# Patient Record
Sex: Male | Born: 1940 | Race: White | Hispanic: No | Smoking: Never smoker
Health system: Southern US, Community
[De-identification: ages and names within clinical notes are randomized; demographics above are authoritative.]

## PROBLEM LIST (undated history)

## (undated) DIAGNOSIS — F0390 Unspecified dementia without behavioral disturbance: Secondary | ICD-10-CM

## (undated) DIAGNOSIS — F329 Major depressive disorder, single episode, unspecified: Secondary | ICD-10-CM

## (undated) DIAGNOSIS — F32A Depression, unspecified: Secondary | ICD-10-CM

---

## 2005-09-28 ENCOUNTER — Ambulatory Visit: Payer: Self-pay | Admitting: General Practice

## 2005-11-29 ENCOUNTER — Ambulatory Visit: Payer: Self-pay | Admitting: General Practice

## 2007-04-27 ENCOUNTER — Emergency Department: Payer: Self-pay | Admitting: Emergency Medicine

## 2007-04-27 ENCOUNTER — Other Ambulatory Visit: Payer: Self-pay

## 2010-02-20 ENCOUNTER — Ambulatory Visit: Payer: Self-pay | Admitting: Unknown Physician Specialty

## 2010-02-21 LAB — PATHOLOGY REPORT

## 2013-07-25 ENCOUNTER — Emergency Department: Payer: Self-pay | Admitting: Emergency Medicine

## 2016-06-17 ENCOUNTER — Emergency Department
Admission: EM | Admit: 2016-06-17 | Discharge: 2016-06-17 | Disposition: A | Discharge status: Home / Self Care | Payer: Medicare Other | Attending: Emergency Medicine | Admitting: Emergency Medicine

## 2016-06-17 ENCOUNTER — Emergency Department: Payer: Medicare Other

## 2016-06-17 ENCOUNTER — Encounter: Payer: Self-pay | Admitting: Emergency Medicine

## 2016-06-17 DIAGNOSIS — R531 Weakness: Secondary | ICD-10-CM | POA: Diagnosis not present

## 2016-06-17 DIAGNOSIS — M7989 Other specified soft tissue disorders: Secondary | ICD-10-CM | POA: Diagnosis not present

## 2016-06-17 DIAGNOSIS — S59902A Unspecified injury of left elbow, initial encounter: Secondary | ICD-10-CM | POA: Diagnosis present

## 2016-06-17 DIAGNOSIS — Y999 Unspecified external cause status: Secondary | ICD-10-CM | POA: Diagnosis not present

## 2016-06-17 DIAGNOSIS — Y92012 Bathroom of single-family (private) house as the place of occurrence of the external cause: Secondary | ICD-10-CM | POA: Insufficient documentation

## 2016-06-17 DIAGNOSIS — Y939 Activity, unspecified: Secondary | ICD-10-CM | POA: Diagnosis not present

## 2016-06-17 DIAGNOSIS — W19XXXA Unspecified fall, initial encounter: Secondary | ICD-10-CM | POA: Insufficient documentation

## 2016-06-17 HISTORY — DX: Depression, unspecified: F32.A

## 2016-06-17 HISTORY — DX: Unspecified dementia, unspecified severity, without behavioral disturbance, psychotic disturbance, mood disturbance, and anxiety: F03.90

## 2016-06-17 HISTORY — DX: Major depressive disorder, single episode, unspecified: F32.9

## 2016-06-17 LAB — URINALYSIS, COMPLETE (UACMP) WITH MICROSCOPIC
Bacteria, UA: NONE SEEN
Bilirubin Urine: NEGATIVE
GLUCOSE, UA: NEGATIVE mg/dL
Hgb urine dipstick: NEGATIVE
Ketones, ur: 5 mg/dL — AB
LEUKOCYTES UA: NEGATIVE
NITRITE: NEGATIVE
Protein, ur: 30 mg/dL — AB
SPECIFIC GRAVITY, URINE: 1.021 (ref 1.005–1.030)
Squamous Epithelial / LPF: NONE SEEN
pH: 7 (ref 5.0–8.0)

## 2016-06-17 LAB — BASIC METABOLIC PANEL
Anion gap: 8 (ref 5–15)
BUN: 27 mg/dL — ABNORMAL HIGH (ref 6–20)
CO2: 27 mmol/L (ref 22–32)
CREATININE: 0.85 mg/dL (ref 0.61–1.24)
Calcium: 9 mg/dL (ref 8.9–10.3)
Chloride: 101 mmol/L (ref 101–111)
GFR calc Af Amer: >60 mL/min (ref >60)
Glucose, Bld: 143 mg/dL — ABNORMAL HIGH (ref 65–99)
POTASSIUM: 4.4 mmol/L (ref 3.5–5.1)
Sodium: 136 mmol/L (ref 135–145)

## 2016-06-17 LAB — CBC
HEMATOCRIT: 49.1 % (ref 40.0–52.0)
Hemoglobin: 16.5 g/dL (ref 13.0–18.0)
MCH: 29.6 pg (ref 26.0–34.0)
MCHC: 33.6 g/dL (ref 32.0–36.0)
MCV: 88.2 fL (ref 80.0–100.0)
PLATELETS: 234 10*3/uL (ref 150–440)
RBC: 5.56 MIL/uL (ref 4.40–5.90)
RDW: 13.4 % (ref 11.5–14.5)
WBC: 18.6 10*3/uL — AB (ref 3.8–10.6)

## 2016-06-17 LAB — TROPONIN I

## 2016-06-17 NOTE — ED Provider Notes (Signed)
[facility] Emergency Department Provider Note   ____________________________________________   I have reviewed the triage vital signs and the nursing notes.   HISTORY  Chief Complaint Fall and Arm Pain   History limited by: Dementia, some history obtained from sister   HPI Ryan Vasquez is a 76 y.o. male who presents to the emergency department today because of concerns for weakness and a fall. The patient had a fall earlier today. He was on his way to the bathroom when he fell. He is not sure why he fell. He does not think he slipped or tripped on anything. He was then in the bathroom for roughly 1 hour before family members were able to get into his house. He at that point he did not want to come to the hospital. He was taken to his sister's house. While there she did notice that he had one episode of weakness. The patient is complaining of some mild pain in the elbow and wrist of the left arm after the fall. He denies any recent illness. Denies any nausea vomiting. No shortness breath or chest pain.   Past Medical History:  Diagnosis Date  . Dementia   . Depression     There are no active problems to display for this patient.   History reviewed. No pertinent surgical history.  Prior to Admission medications   Not on File    Allergies Patient has no known allergies.  No family history on file.  Social History Social History  Substance Use Topics  . Smoking status: Never Smoker  . Smokeless tobacco: Never Used  . Alcohol use No    Review of Systems  Constitutional: Negative for fever. Cardiovascular: Negative for chest pain. Respiratory: Negative for shortness of breath. Gastrointestinal: Negative for abdominal pain, vomiting and diarrhea. Genitourinary: Negative for dysuria. Musculoskeletal: Positive for left elbow/wrist pain. Skin: Negative for rash. Neurological: Negative for headaches, focal weakness or numbness.  10-point  ROS otherwise negative.  ____________________________________________   PHYSICAL EXAM:  VITAL SIGNS: ED Triage Vitals  Enc Vitals Group     BP 06/17/16 1656 110/70     Pulse Rate 06/17/16 1656 76     Resp 06/17/16 1656 16     Temp 06/17/16 1656 98.3 F (36.8 C)     Temp Source 06/17/16 1656 Oral     SpO2 06/17/16 1656 99 %     Weight --      Height --      Head Circumference --      Peak Flow --      Pain Score 06/17/16 1652 4   Constitutional: Alert and oriented. Well appearing and in no distress. Eyes: Conjunctivae are normal. Normal extraocular movements. ENT   Head: Normocephalic and atraumatic.   Nose: No congestion/rhinnorhea.   Mouth/Throat: Mucous membranes are moist.   Neck: No stridor. Hematological/Lymphatic/Immunilogical: No cervical lymphadenopathy. Cardiovascular: Normal rate, regular rhythm.  No murmurs, rubs, or gallops.  Respiratory: Normal respiratory effort without tachypnea nor retractions. Breath sounds are clear and equal bilaterally. No wheezes/rales/rhonchi. Gastrointestinal: Soft and non tender. No rebound. No guarding.  Genitourinary: Deferred Musculoskeletal: Some swelling to left wrist, no tenderness. Full non painful range of motion at the wrist. Left elbow without any osseous tenderness.  Neurologic:  Normal speech and language. No gross focal neurologic deficits are appreciated.  Skin:  Abrasion to left elbow. Psychiatric: Mood and affect are normal. Speech and behavior are normal. Patient exhibits appropriate insight and judgment.  ____________________________________________  LABS (pertinent positives/negatives)  Labs Reviewed  BASIC METABOLIC PANEL - Abnormal; Notable for the following:       Result Value   Glucose, Bld 143 (*)    BUN 27 (*)    All other components within normal limits  CBC - Abnormal; Notable for the following:    WBC 18.6 (*)    All other components within normal limits  URINALYSIS, COMPLETE  (UACMP) WITH MICROSCOPIC - Abnormal; Notable for the following:    Color, Urine YELLOW (*)    APPearance CLEAR (*)    Ketones, ur 5 (*)    Protein, ur 30 (*)    All other components within normal limits  TROPONIN I     ____________________________________________   EKG  I, Phineas Semen, attending physician, personally viewed and interpreted this EKG  EKG Time: 1657 Rate: 84 Rhythm: sinus rhythm Axis: normal Intervals: qtc 454 QRS: narrow ST changes: no st elevation Impression: normal ekg   ____________________________________________    RADIOLOGY  CXR  IMPRESSION: No active cardiopulmonary disease.  ____________________________________________   PROCEDURES  Procedures  ____________________________________________   INITIAL IMPRESSION / ASSESSMENT AND PLAN / ED COURSE  Pertinent labs & imaging results that were available during my care of the patient were reviewed by me and considered in my medical decision making (see chart for details).  Patient presented to the emergency department today after all concern for some weakness. Workup here showed an elevation of the white blood cell count. Chest x-ray and urine within normal limits. Patient did not appear ill at all his time here in the emergency department. We did get him up and ambulated without any difficulty. This point I wonder if patient could have a viral illness causing the elevation of white blood cell count. The patient did not have any focal weakness to just stroke. Patient insisted were comfortable going home. He will follow up with primary care in the next day or 2.  ____________________________________________   FINAL CLINICAL IMPRESSION(S) / ED DIAGNOSES  Final diagnoses:  Fall, initial encounter     Note: This dictation was prepared with Dragon dictation. Any transcriptional errors that result from this process are unintentional     Phineas Semen, MD 06/17/16 2113

## 2016-06-17 NOTE — ED Triage Notes (Addendum)
Pt to  ED via EMS from home. Per EMS was called out twice today, pt had unwitnessed fall this am and was on floor of bathroom x2hrs. Pt refused transportation. Last called out for family states pt was "leaning to one side and faint like". Per EMS pt was A&Ox4 on scene, VS stable. Pt c/o LFT arm and wrist pain. Per EMS pt family member has dementia. Pt in NAD at this time. Swelling noted to LFT elbow and wrist. Pt denies any LOC

## 2016-06-17 NOTE — ED Notes (Signed)
Pt ambulatory with one assist, steady gait noted. MD notified

## 2016-06-17 NOTE — ED Notes (Signed)
Pt up to side of bed with assistance, unable to void at this time. MD notified

## 2016-06-17 NOTE — Discharge Instructions (Signed)
Please seek medical attention for any high fevers, chest pain, shortness of breath, change in behavior, persistent vomiting, bloody stool or any other new or concerning symptoms.  

## 2016-07-05 ENCOUNTER — Other Ambulatory Visit: Payer: Self-pay | Admitting: Nurse Practitioner

## 2016-07-05 DIAGNOSIS — R413 Other amnesia: Secondary | ICD-10-CM

## 2016-07-15 ENCOUNTER — Ambulatory Visit
Admission: RE | Admit: 2016-07-15 | Discharge: 2016-07-15 | Disposition: A | Discharge status: Home / Self Care | Payer: Medicare Other | Source: Ambulatory Visit | Attending: Nurse Practitioner | Admitting: Nurse Practitioner

## 2016-07-15 DIAGNOSIS — R413 Other amnesia: Secondary | ICD-10-CM

## 2016-07-15 DIAGNOSIS — I6782 Cerebral ischemia: Secondary | ICD-10-CM | POA: Diagnosis not present

## 2016-07-15 DIAGNOSIS — G319 Degenerative disease of nervous system, unspecified: Secondary | ICD-10-CM | POA: Diagnosis not present

## 2016-07-15 DIAGNOSIS — J329 Chronic sinusitis, unspecified: Secondary | ICD-10-CM | POA: Diagnosis not present

## 2016-07-15 MED ORDER — GADOBENATE DIMEGLUMINE 529 MG/ML IV SOLN
18.0000 mL | Freq: Once | INTRAVENOUS | Status: AC | PRN
  Administered 2016-07-15: 18 mL via INTRAVENOUS

## 2017-03-17 DEATH — deceased

## 2017-12-22 IMAGING — CR DG CHEST 2V
1 series · 2 of 2 positions shown · non-contrast
Comparison: None.

CLINICAL DATA: Status post fall today.

EXAM:
CHEST  2 VIEW

[Series 1: dg chest 2 view · 0.14mm/px · 2 of 2 slices shown]
[im 1/2]
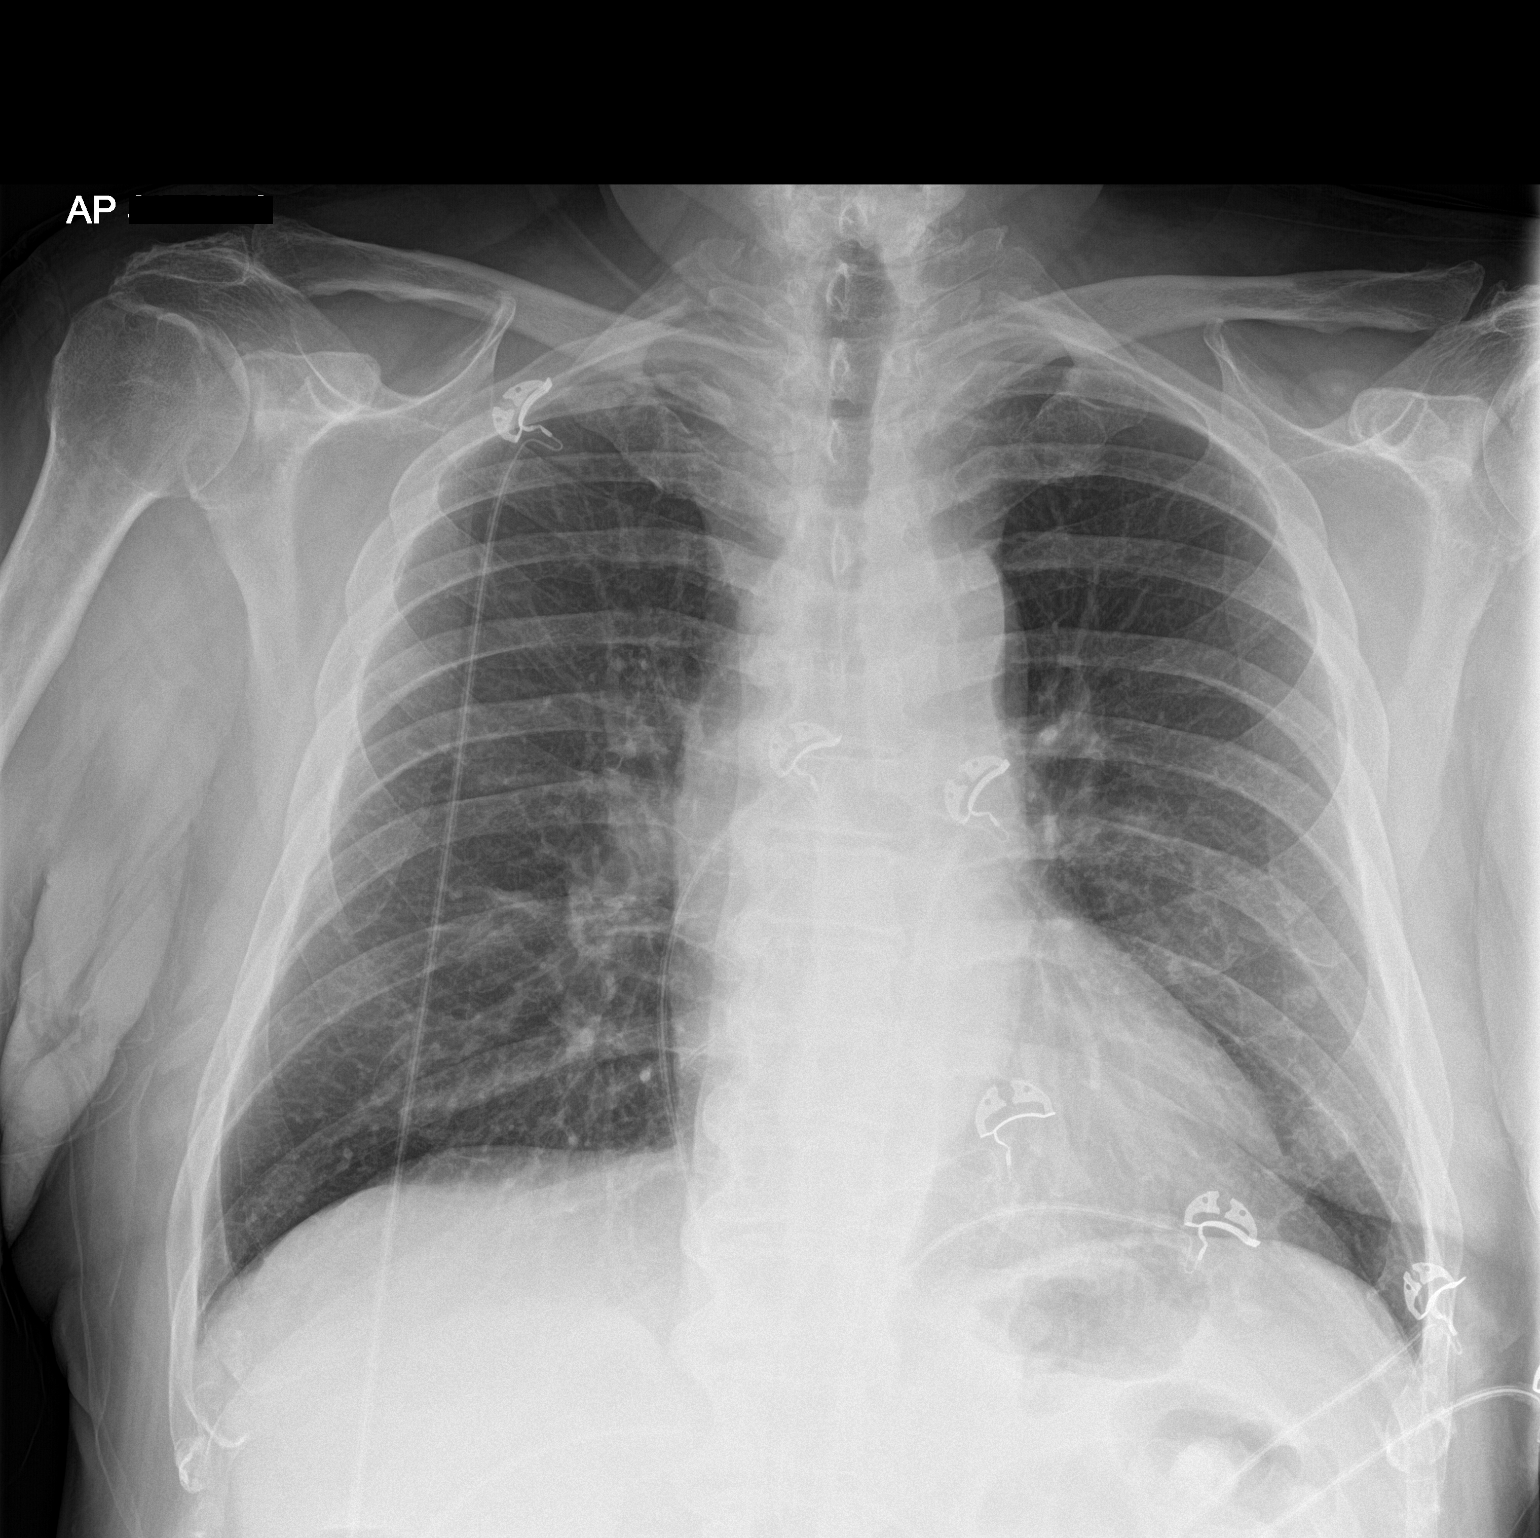
[im 2/2]
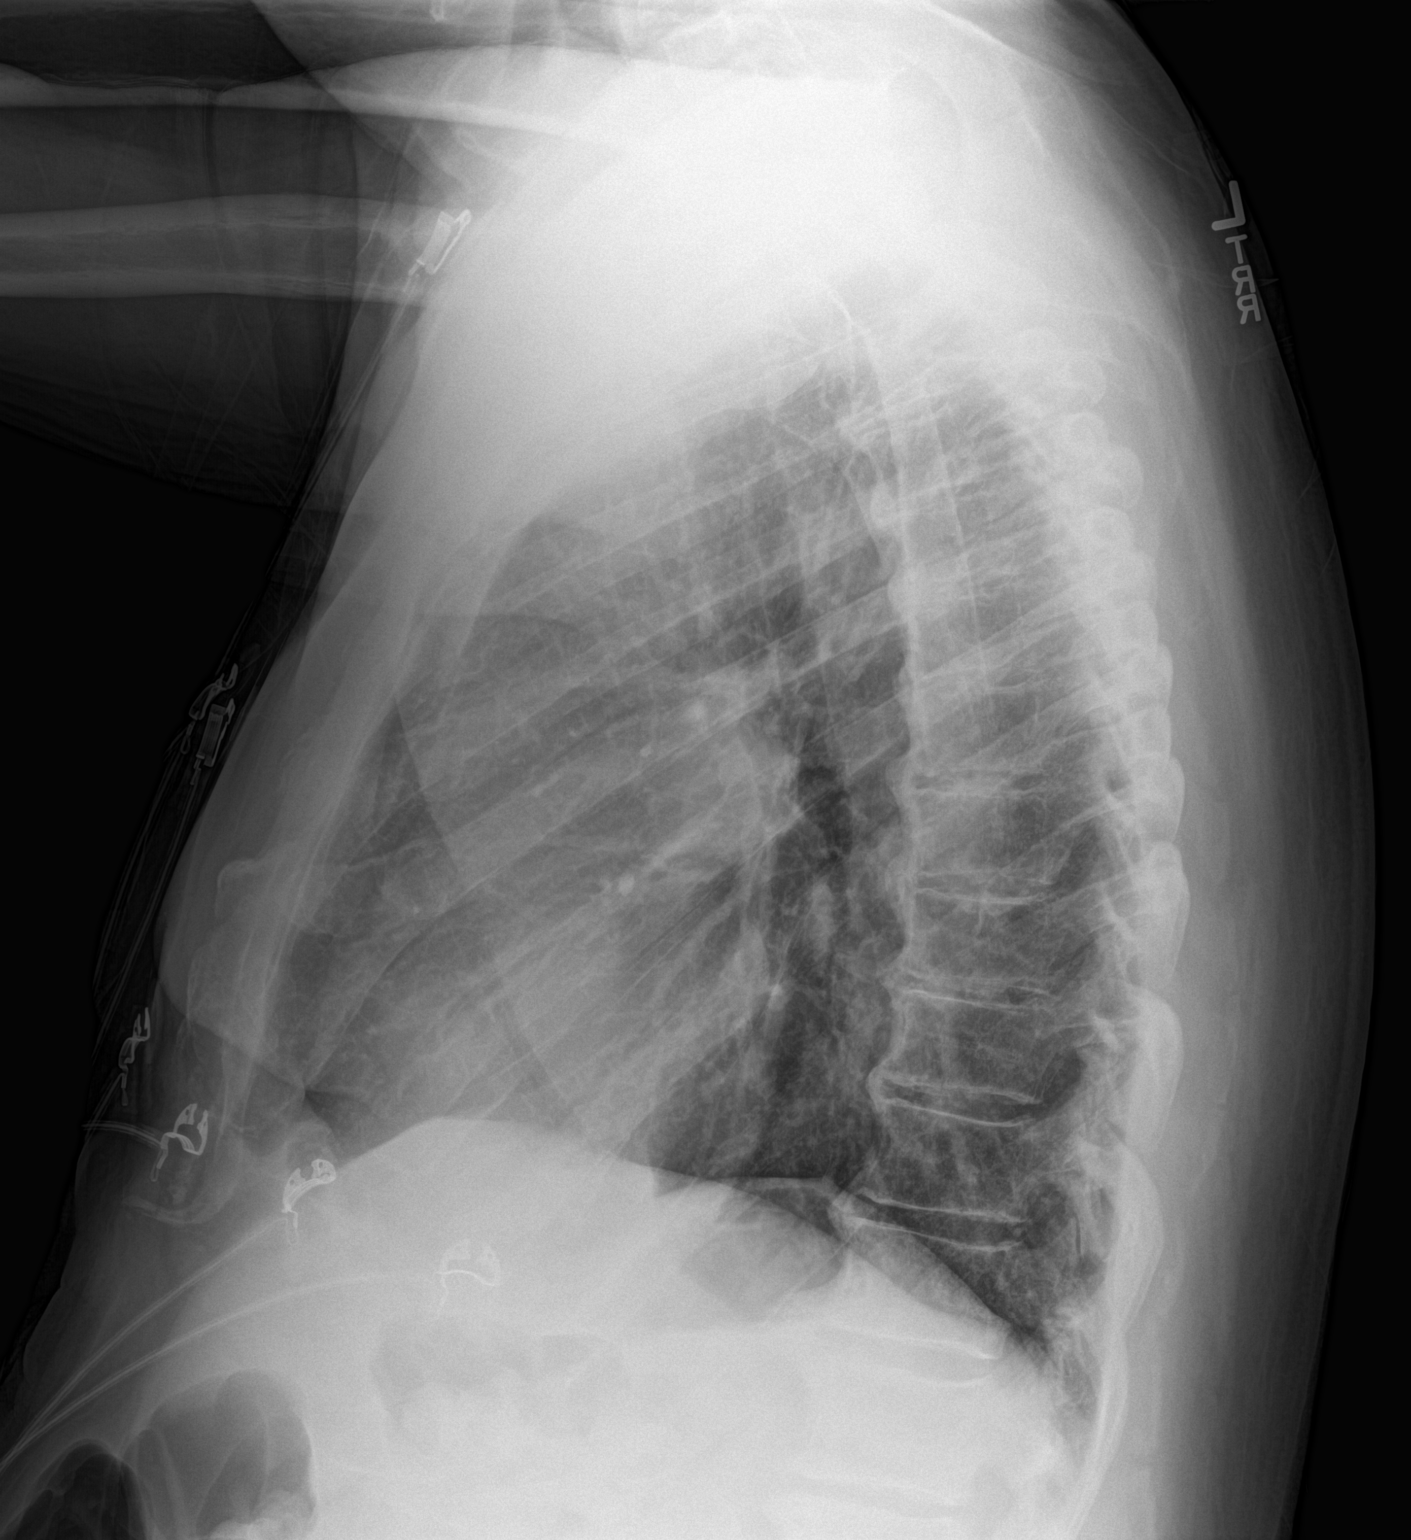

[2 of 2 positions shown; findings below may reference images not displayed]

FINDINGS: The heart size and mediastinal contours are within normal limits.
There is no focal infiltrate, pulmonary edema, or pleural effusion.
Degenerative joint changes of the spine are identified. Visualized
ribs demonstrate no acute fracture or dislocation.
IMPRESSION: No active cardiopulmonary disease.

## 2018-01-19 IMAGING — MR MR HEAD WO/W CM
12 series · 48 of 48 positions shown · IV contrast (18mL MULTIHANCE)
Comparison: None.

CLINICAL DATA: Found on the floor 4 weeks ago. Memory loss and
confusion since then.

EXAM:
MRI HEAD WITHOUT AND WITH CONTRAST
TECHNIQUE: Multiplanar, multiecho pulse sequences of the brain and surrounding
structures were obtained without and with intravenous contrast.
CONTRAST:  18mL MULTIHANCE GADOBENATE DIMEGLUMINE 529 MG/ML IV SOLN

[Series 2: T1 · sagittal · 5.0mm · 0.45mm/px · 4 of 27 slices shown (1 of 2)]
[im 1/27]
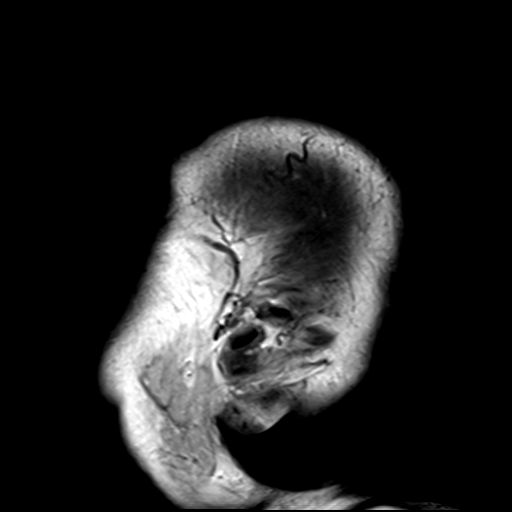
[im 9/27]
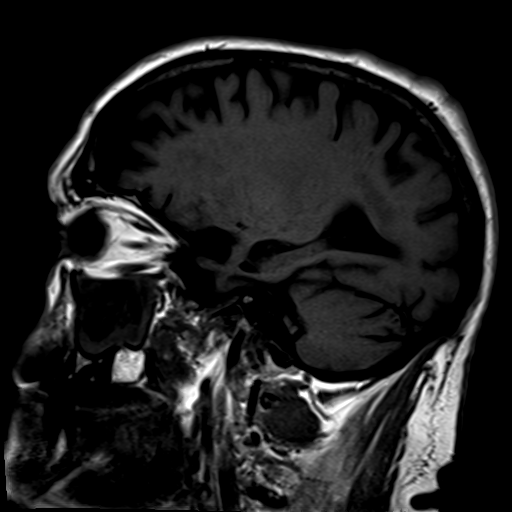
[im 18/27]
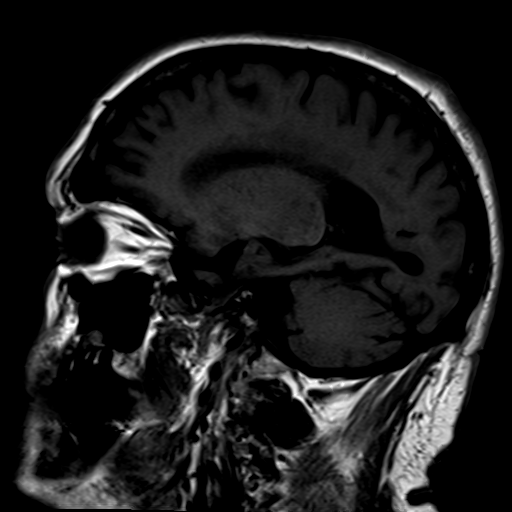
[im 27/27]
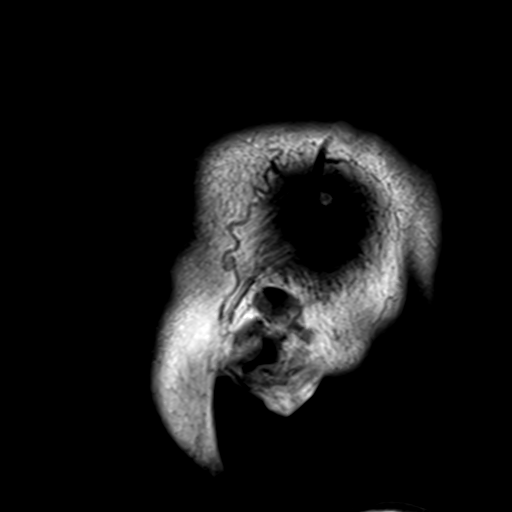

[Series 4: DWI · axial · 3.0mm · 1.80mm/px · z∈[-42,+116]mm · 5 of 55 slices shown (1 of 4)]
[im 1/55]
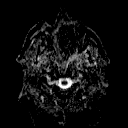
[im 14/55]
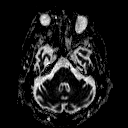
[im 28/55]
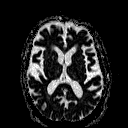
[im 41/55]
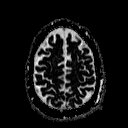
[im 55/55]
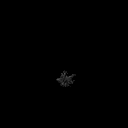

[Series 6: DWI · coronal · 3.0mm · 1.80mm/px · 5 of 47 slices shown (2 of 4)]
[im 1/47]
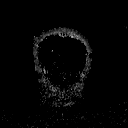
[im 12/47]
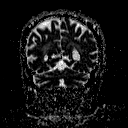
[im 24/47]
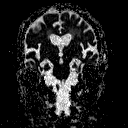
[im 35/47]
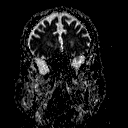
[im 47/47]
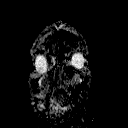

[Series 7: T2 · axial · 5.0mm · 0.60mm/px · z∈[-42,+110]mm · 2 of 25 slices shown (1 of 2)]
[im 1/25]
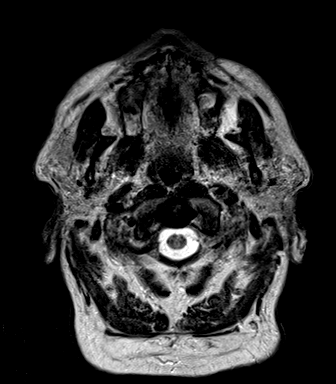
[im 25/25]
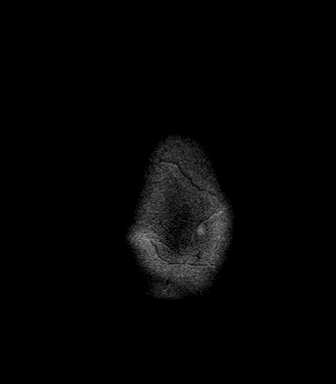

[Series 8: FLAIR · axial · 5.0mm · 0.45mm/px · z∈[-42,+110]mm · 2 of 25 slices shown]
[im 1/25]
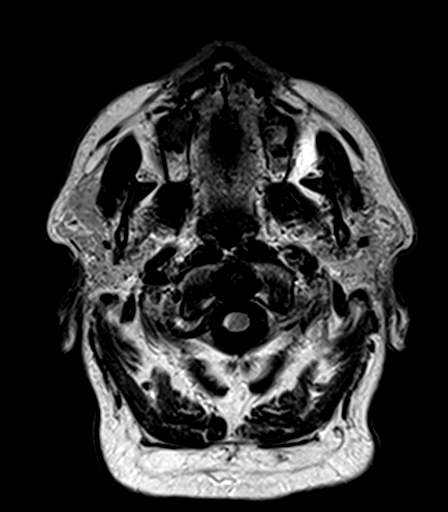
[im 25/25]
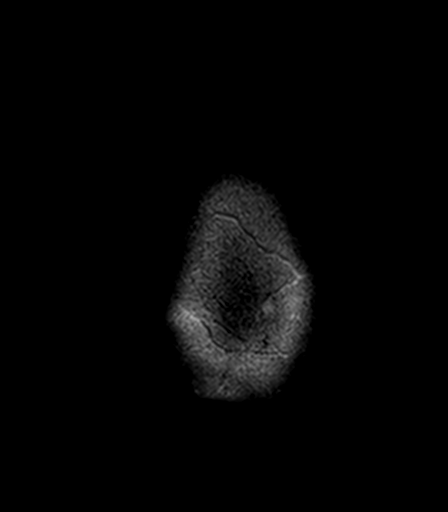

[Series 9: T2 · axial · 5.0mm · 0.45mm/px · z∈[-42,+110]mm · 2 of 25 slices shown (2 of 2)]
[im 1/25]
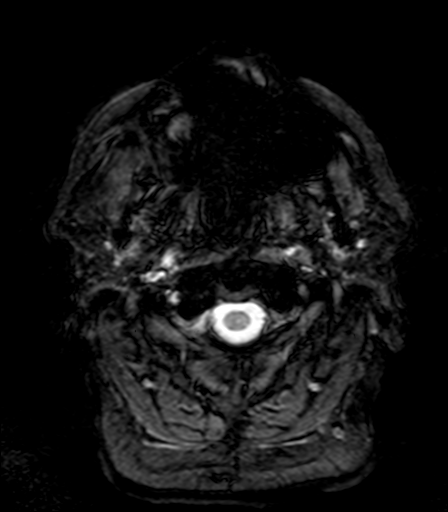
[im 25/25]
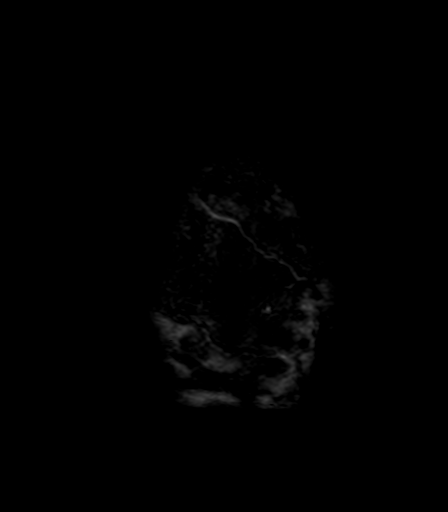

[Series 10: T1 · axial · 3.0mm · 1.00mm/px · z∈[-48,+123]mm · 6 of 60 slices shown (2 of 2)]
[im 1/60]
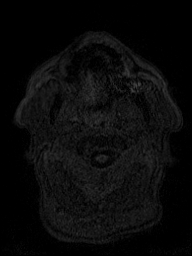
[im 12/60]
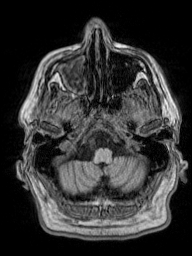
[im 24/60]
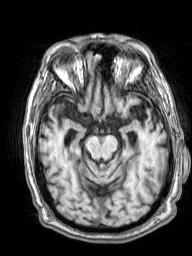
[im 36/60]
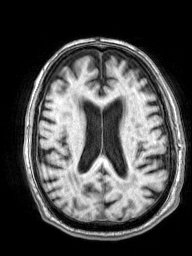
[im 48/60]
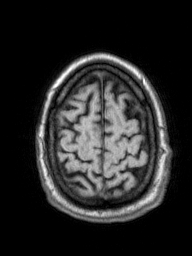
[im 60/60]
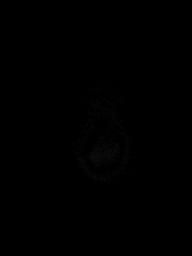

[Series 11: T2 post-contrast · coronal · 5.0mm · 0.49mm/px · 3 of 27 slices shown]
[im 1/27]
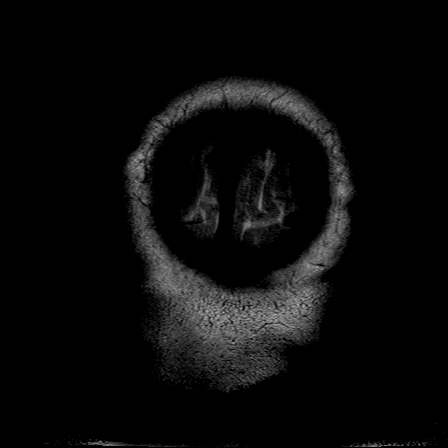
[im 14/27]
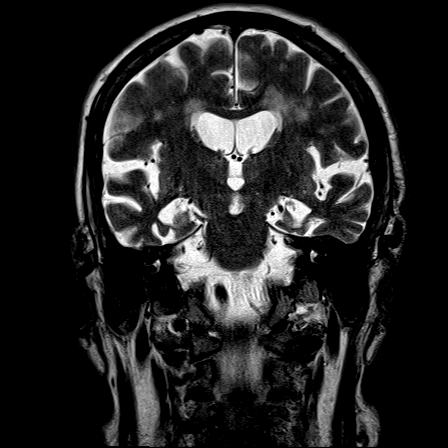
[im 27/27]
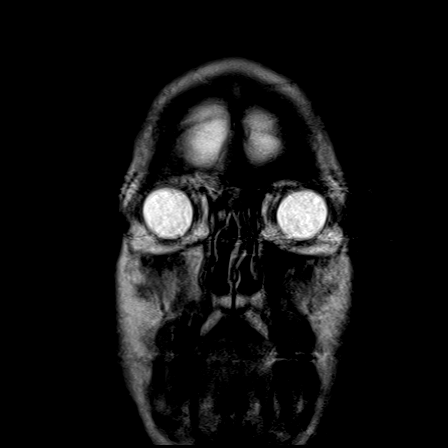

[Series 12: T1 post-contrast · axial · 3.0mm · 1.00mm/px · z∈[-48,+123]mm · 6 of 60 slices shown (1 of 2)]
[im 1/60]
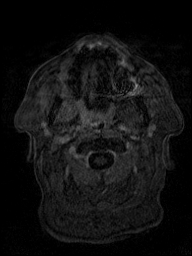
[im 12/60]
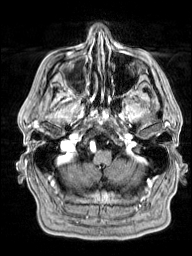
[im 24/60]
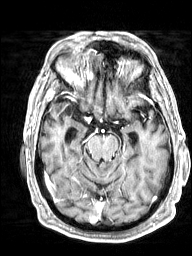
[im 36/60]
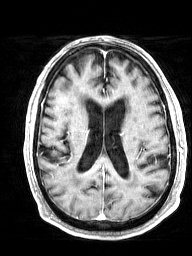
[im 48/60]
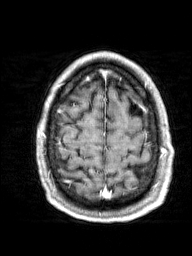
[im 60/60]
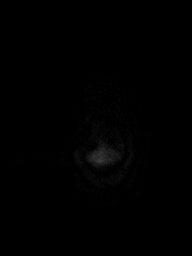

[Series 13: T1 post-contrast · coronal · 5.0mm · 0.43mm/px · 3 of 29 slices shown (2 of 2)]
[im 1/29]
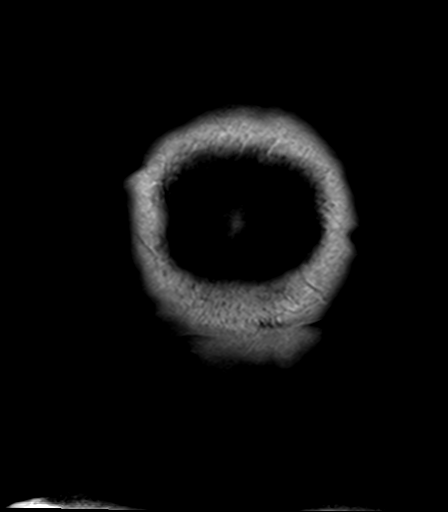
[im 15/29]
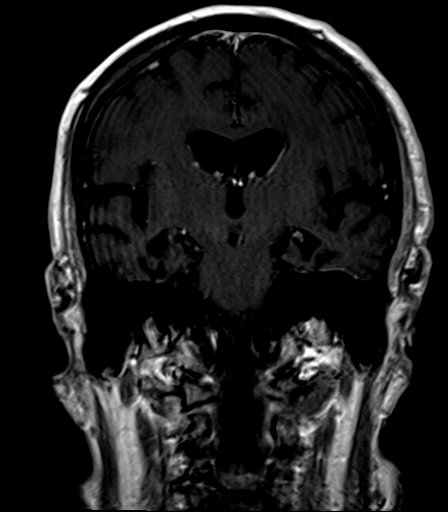
[im 29/29]
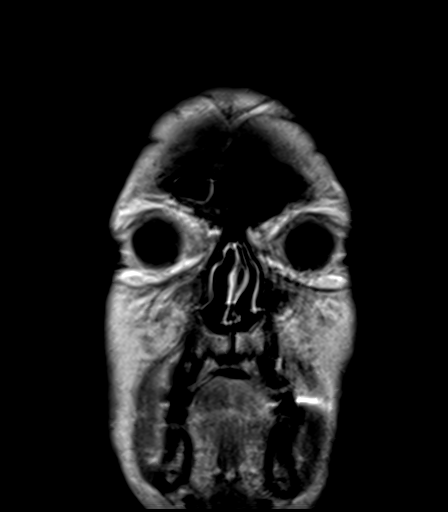

[Series 100: DWI · axial · 3.0mm · 1.80mm/px · z∈[-42,+116]mm · 5 of 55 slices shown (3 of 4)]
[im 1/55]
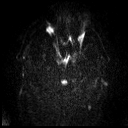
[im 14/55]
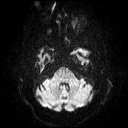
[im 28/55]
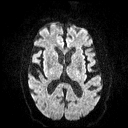
[im 41/55]
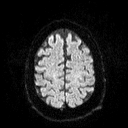
[im 55/55]
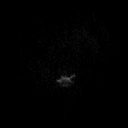

[Series 101: DWI · coronal · 3.0mm · 1.80mm/px · 5 of 46 slices shown (4 of 4)]
[im 1/46]
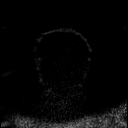
[im 12/46]
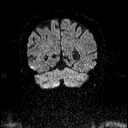
[im 23/46]
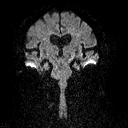
[im 34/46]
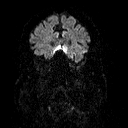
[im 46/46]
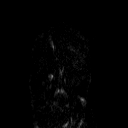

[48 of 48 positions shown; findings below may reference images not displayed]

FINDINGS: Brain: Diffusion imaging does not show any acute or subacute
infarction. Chronic small-vessel ischemic changes affect the pons.
No focal cerebellar finding. Cerebral hemispheres show generalized
atrophy with chronic small-vessel ischemic changes affecting the
deep and subcortical white matter, thalami and basal ganglia. No
large vessel territory infarction. No mass lesion, hemorrhage,
hydrocephalus or extra-axial collection. After contrast
administration, no abnormal enhancement occurs.

Vascular: Major vessels at the base of the brain show flow.

Skull and upper cervical spine: Negative

Sinuses/Orbits: Right maxillary, ethmoid and frontal sinusitis.
Orbits negative.

Other: None significant
IMPRESSION: No acute or reversible intracranial finding. Atrophy and chronic
small-vessel ischemic changes throughout the brain as outlined
above.

Paranasal sinusitis on the right.
